# Patient Record
Sex: Male | Born: 1980 | Race: Black or African American | Hispanic: No | Marital: Married
Health system: Southern US, Community
[De-identification: ages and names within clinical notes are randomized; demographics above are authoritative.]

---

## 2021-04-10 ENCOUNTER — Other Ambulatory Visit: Payer: Self-pay

## 2021-04-10 ENCOUNTER — Emergency Department (HOSPITAL_COMMUNITY): Payer: BC Managed Care – PPO

## 2021-04-10 ENCOUNTER — Emergency Department (HOSPITAL_COMMUNITY)
Admission: EM | Admit: 2021-04-10 | Discharge: 2021-04-10 | Disposition: A | Discharge status: Home / Self Care | Payer: BC Managed Care – PPO | Attending: Emergency Medicine | Admitting: Emergency Medicine

## 2021-04-10 DIAGNOSIS — R030 Elevated blood-pressure reading, without diagnosis of hypertension: Secondary | ICD-10-CM | POA: Diagnosis present

## 2021-04-10 DIAGNOSIS — R519 Headache, unspecified: Secondary | ICD-10-CM | POA: Insufficient documentation

## 2021-04-10 DIAGNOSIS — I10 Essential (primary) hypertension: Secondary | ICD-10-CM | POA: Insufficient documentation

## 2021-04-10 MED ORDER — HYDROCODONE-ACETAMINOPHEN 5-325 MG PO TABS
1.0000 | ORAL_TABLET | Freq: Once | ORAL | Status: AC
  Administered 2021-04-10: 1 via ORAL
  Filled 2021-04-10: qty 1

## 2021-04-10 MED ORDER — KETOROLAC TROMETHAMINE 30 MG/ML IJ SOLN
30.0000 mg | Freq: Once | INTRAMUSCULAR | Status: AC
  Administered 2021-04-10: 30 mg via INTRAMUSCULAR
  Filled 2021-04-10: qty 1

## 2021-04-10 MED ORDER — METOCLOPRAMIDE HCL 5 MG/ML IJ SOLN
10.0000 mg | Freq: Once | INTRAMUSCULAR | Status: AC
  Administered 2021-04-10: 10 mg via INTRAMUSCULAR
  Filled 2021-04-10: qty 2

## 2021-04-10 NOTE — ED Provider Notes (Signed)
[facility]-EMERGENCY DEPT Provider Note   CSN: [id number] Arrival date & time: 04/10/21  1512     History Chief Complaint  Patient presents with   Hypertension    James Smith is a 40 y.o. male.  40 year old male presents with frontal headache times several days.  Denies any emesis.  No fever or chills.  No visual loss but slight blurred vision.  Does have a history of migraines in the past.  Has had decreased sleep recently.  No prior history of high blood pressure.  Denies any chest pain or chest pressure.  He has not been short of breath.  No treatment use prior to arrival nothing makes his symptoms better or worse      No past medical history on file.  There are no problems to display for this patient.        No family history on file.     Home Medications Prior to Admission medications   Not on File    Allergies    Patient has no known allergies.  Review of Systems   Review of Systems  All other systems reviewed and are negative.  Physical Exam Updated Vital Signs BP (!) 175/135 (BP Location: Left Arm)   Pulse 86   Temp 98.4 F (36.9 C) (Oral)   Resp 18   SpO2 100%   Physical Exam Vitals and nursing note reviewed.  Constitutional:      General: He is not in acute distress.    Appearance: Normal appearance. He is well-developed. He is not toxic-appearing.  HENT:     Head: Normocephalic and atraumatic.  Eyes:     General: Lids are normal.     Conjunctiva/sclera: Conjunctivae normal.     Pupils: Pupils are equal, round, and reactive to light.  Neck:     Thyroid: No thyroid mass.     Trachea: No tracheal deviation.  Cardiovascular:     Rate and Rhythm: Normal rate and regular rhythm.     Heart sounds: Normal heart sounds. No murmur heard.   No gallop.  Pulmonary:     Effort: Pulmonary effort is normal. No respiratory distress.     Breath sounds: Normal breath sounds. No stridor. No decreased breath sounds,  wheezing, rhonchi or rales.  Abdominal:     General: There is no distension.     Palpations: Abdomen is soft.     Tenderness: There is no abdominal tenderness. There is no rebound.  Musculoskeletal:        General: No tenderness. Normal range of motion.     Cervical back: Normal range of motion and neck supple.  Skin:    General: Skin is warm and dry.     Findings: No abrasion or rash.  Neurological:     Mental Status: He is alert and oriented to person, place, and time. Mental status is at baseline.     GCS: GCS eye subscore is 4. GCS verbal subscore is 5. GCS motor subscore is 6.     Cranial Nerves: Cranial nerves are intact. No cranial nerve deficit.     Sensory: No sensory deficit.     Motor: Motor function is intact.  Psychiatric:        Attention and Perception: Attention normal.        Speech: Speech normal.        Behavior: Behavior normal.    ED Results / Procedures / Treatments   Labs (all labs ordered are listed, but only  abnormal results are displayed) Labs Reviewed - No data to display  EKG None  Radiology CT Head Wo Contrast  Result Date: 04/10/2021 CLINICAL DATA:  Headache. Symptoms for 3 days. Dizziness and blurred vision. History of migraines. EXAM: CT HEAD WITHOUT CONTRAST TECHNIQUE: Contiguous axial images were obtained from the base of the skull through the vertex without intravenous contrast. COMPARISON:  None. FINDINGS: Brain: No mass lesion, hemorrhage, hydrocephalus, acute infarct, intra-axial, or extra-axial fluid collection. Vascular: No hyperdense vessel or unexpected calcification. Skull: Normal Sinuses/Orbits: Normal imaged portions of the orbits and globes. Clear paranasal sinuses and mastoid air cells. Other: None. IMPRESSION: Normal head CT. Electronically Signed   By: Jeronimo Greaves M.D.   On: 04/10/2021 15:51    Procedures Procedures   Medications Ordered in ED Medications  metoCLOPramide (REGLAN) injection 10 mg (has no administration in time  range)    ED Course  I have reviewed the triage vital signs and the nursing notes.  Pertinent labs & imaging results that were available during my care of the patient were reviewed by me and considered in my medical decision making (see chart for details).    MDM Rules/Calculators/A&P                           Patient medicated for headache here and feels better.  Head CT is negative.  Neurological exam is stable.  Will discharge home Final Clinical Impression(s) / ED Diagnoses Final diagnoses:  None    Rx / DC Orders ED Discharge Orders     None        Lorre Nick, MD 04/10/21 1836

## 2021-04-10 NOTE — ED Provider Notes (Signed)
Emergency Medicine Provider Triage Evaluation Note  James Smith , a 40 y.o. male  was evaluated in triage.  Pt complains of headache x3 days. He further c/o dizziness and blurred vision to the left eye. States he had migraines in his 25s but he has not had a migraine for many years   Review of Systems  Positive: Headache, dizziness, blurred vision Negative: Numbness, weakness  Physical Exam  BP (!) 175/135 (BP Location: Left Arm)   Pulse 86   Temp 98.4 F (36.9 C) (Oral)   Resp 18   SpO2 100%  Gen:   Awake, no distress   Resp:  Normal effort  MSK:   Moves extremities without difficulty  Other:  Clear speech, no facial droop, normal coordination  Medical Decision Making  Medically screening exam initiated at 3:35 PM.  Appropriate orders placed.  Taitum Alms was informed that the remainder of the evaluation will be completed by another provider, this initial triage assessment does not replace that evaluation, and the importance of remaining in the ED until their evaluation is complete.     Rayne Du 04/10/21 1537    Lorre Nick, MD 04/10/21 2258

## 2021-04-10 NOTE — Discharge Instructions (Addendum)
Have your blood pressure rechecked by your doctor

## 2021-04-10 NOTE — ED Triage Notes (Signed)
Pt states that for the past 3 days he has headache that is across his forehead.  Responds to OTC meds but then returns soon after. No hx of high BP.  Recently had physical for work with no concerns.

## 2022-10-23 ENCOUNTER — Emergency Department (HOSPITAL_COMMUNITY)
Admission: EM | Admit: 2022-10-23 | Discharge: 2022-10-24 | Discharge status: Against Medical Advice | Payer: BC Managed Care – PPO | Attending: Emergency Medicine | Admitting: Emergency Medicine

## 2022-10-23 ENCOUNTER — Encounter (HOSPITAL_COMMUNITY): Payer: Self-pay

## 2022-10-23 ENCOUNTER — Other Ambulatory Visit: Payer: Self-pay

## 2022-10-23 ENCOUNTER — Emergency Department (HOSPITAL_COMMUNITY): Payer: BC Managed Care – PPO

## 2022-10-23 DIAGNOSIS — R079 Chest pain, unspecified: Secondary | ICD-10-CM | POA: Insufficient documentation

## 2022-10-23 DIAGNOSIS — Z5321 Procedure and treatment not carried out due to patient leaving prior to being seen by health care provider: Secondary | ICD-10-CM | POA: Diagnosis not present

## 2022-10-23 LAB — CBC
HCT: 42.4 % (ref 39.0–52.0)
Hemoglobin: 14.3 g/dL (ref 13.0–17.0)
MCH: 28.5 pg (ref 26.0–34.0)
MCHC: 33.7 g/dL (ref 30.0–36.0)
MCV: 84.6 fL (ref 80.0–100.0)
Platelets: 234 10*3/uL (ref 150–400)
RBC: 5.01 MIL/uL (ref 4.22–5.81)
RDW: 12.1 % (ref 11.5–15.5)
WBC: 5.1 10*3/uL (ref 4.0–10.5)
nRBC: 0 % (ref 0.0–0.2)

## 2022-10-23 LAB — BASIC METABOLIC PANEL
Anion gap: 10 (ref 5–15)
BUN: 9 mg/dL (ref 6–20)
CO2: 27 mmol/L (ref 22–32)
Calcium: 9.4 mg/dL (ref 8.9–10.3)
Chloride: 102 mmol/L (ref 98–111)
Creatinine, Ser: 1.26 mg/dL — ABNORMAL HIGH (ref 0.61–1.24)
GFR, Estimated: >60 mL/min (ref >60)
Glucose, Bld: 104 mg/dL — ABNORMAL HIGH (ref 70–99)
Potassium: 3.2 mmol/L — ABNORMAL LOW (ref 3.5–5.1)
Sodium: 139 mmol/L (ref 135–145)

## 2022-10-23 LAB — TROPONIN I (HIGH SENSITIVITY): Troponin I (High Sensitivity): 5 ng/L (ref <18)

## 2022-10-23 NOTE — ED Triage Notes (Signed)
Pt arrived from home via POV c/o Chest pain 4/10 at present. Pt states that it just feels like a lot of pressure. Pt states that it started yesterday but he can no longer ignore it. Pt states that he has a lot of stress on him at present.

## 2022-10-24 IMAGING — CT CT HEAD W/O CM
3 series · 14 of 47 positions shown, 16 images · non-contrast
Comparison: None.

CLINICAL DATA: Headache. Symptoms for 3 days. Dizziness and blurred
vision. History of migraines.

EXAM:
CT HEAD WITHOUT CONTRAST
TECHNIQUE: Contiguous axial images were obtained from the base of the skull
through the vertex without intravenous contrast.

[Series 2: head wo · axial · 0.47mm/px · z∈[+1461,+1601]mm · 8 of 34 slices shown, 10 images]
[im 3/34  brain]
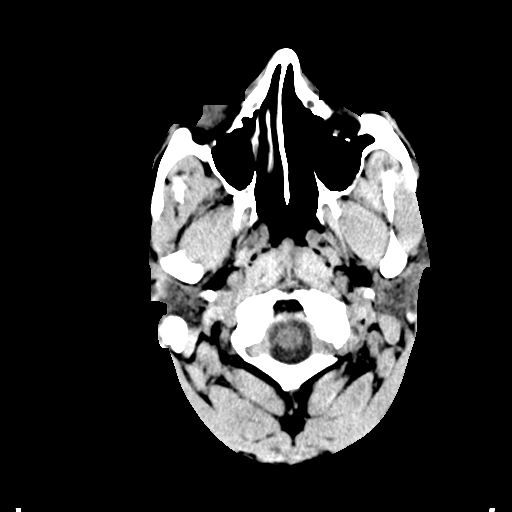
[im 3/34  bone]
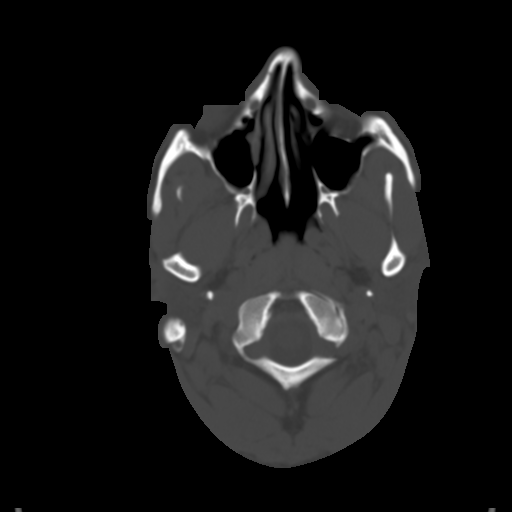
[im 7/34  brain]
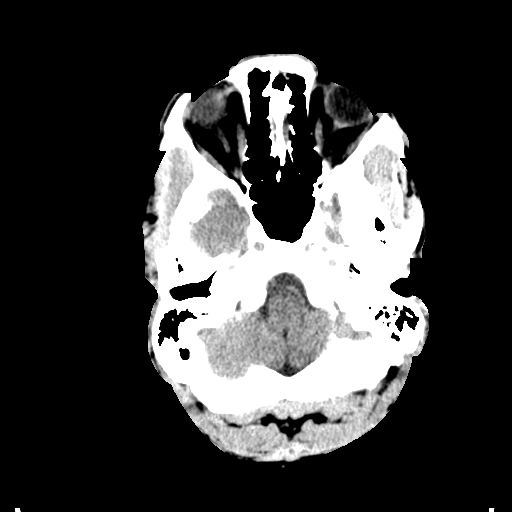
[im 11/34  brain]
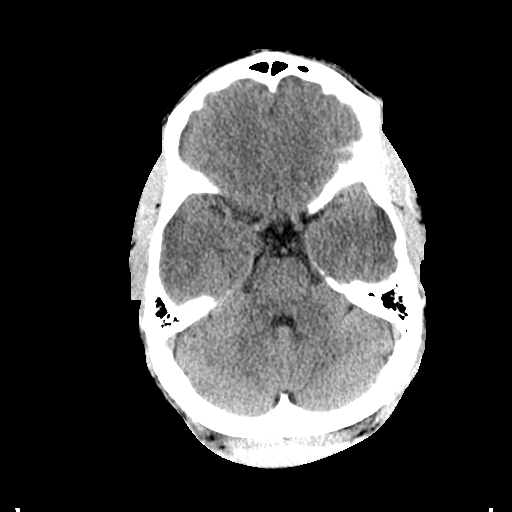
[im 15/34  brain]
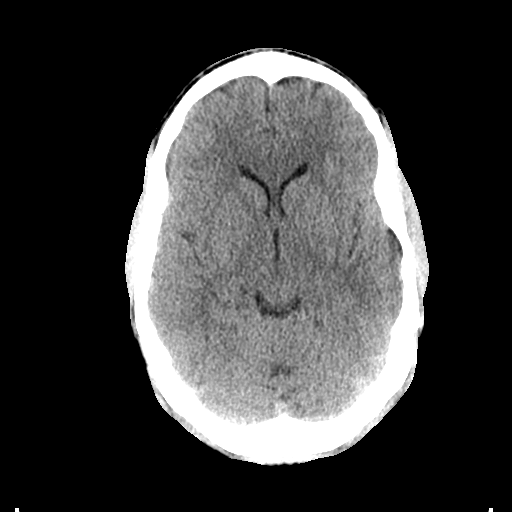
[im 19/34  brain]
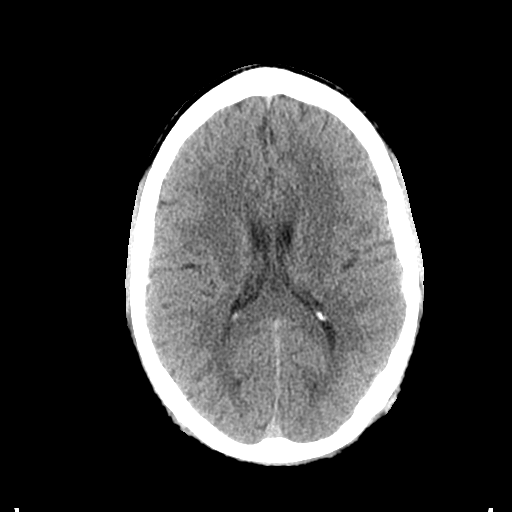
[im 19/34  bone]
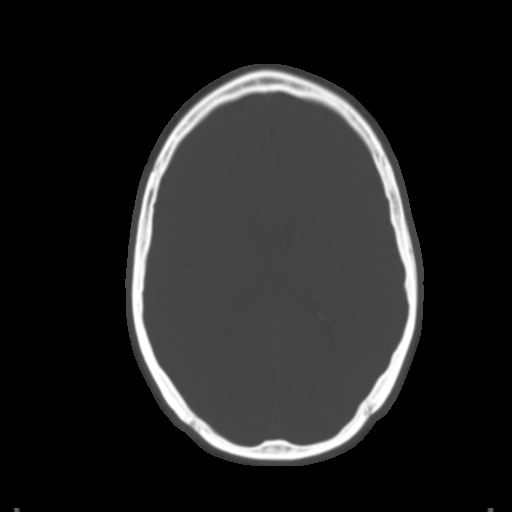
[im 23/34  brain]
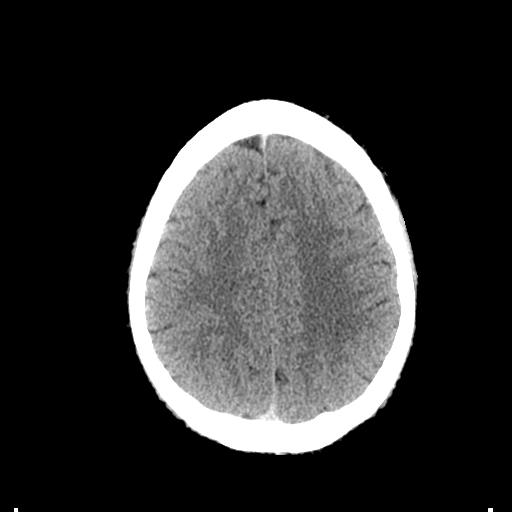
[im 27/34  brain]
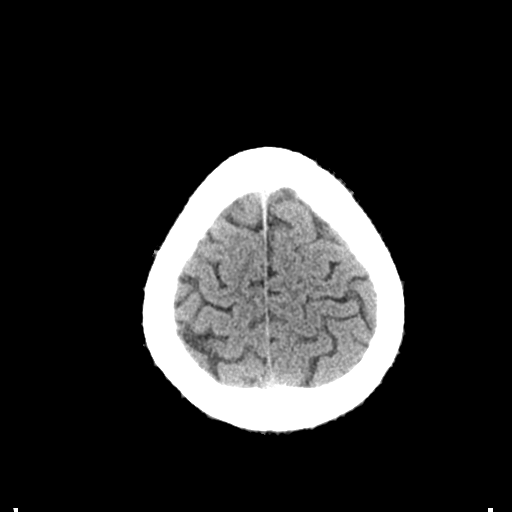
[im 31/34  brain]
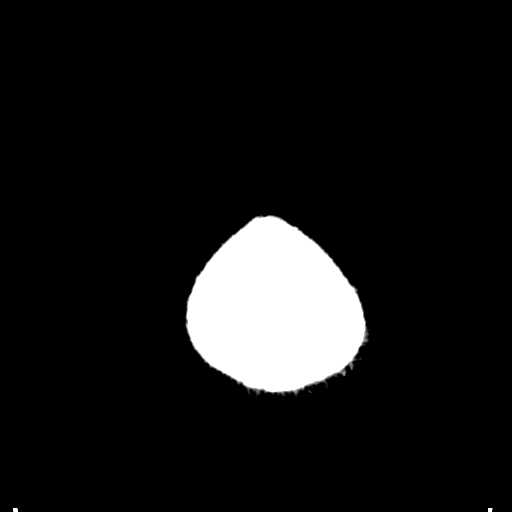

[Series 5: coronal soft tissue · coronal · 0.34mm/px · 3 of 71 slices shown]
[im 24/71  brain]
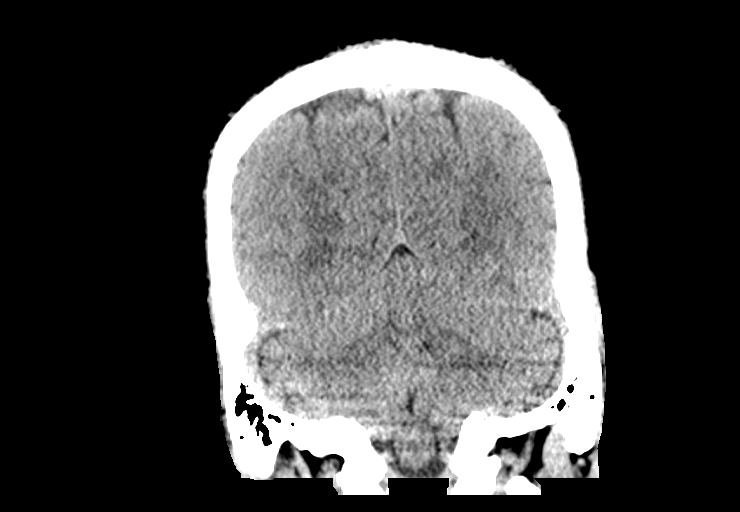
[im 32/71  brain]
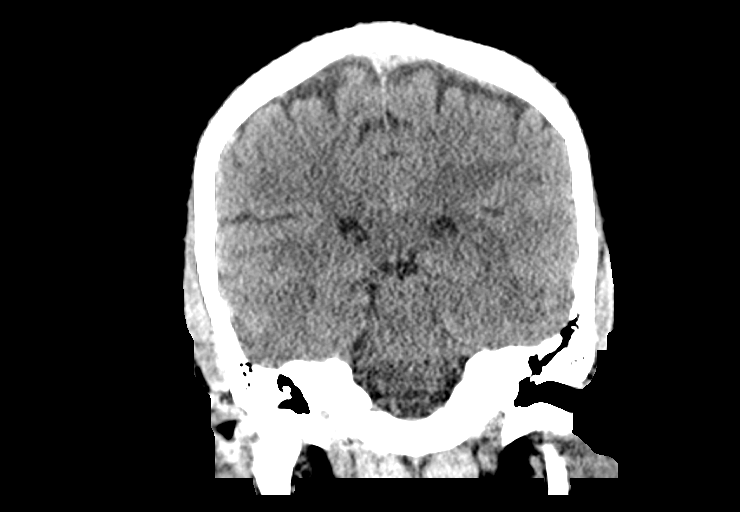
[im 39/71  brain]
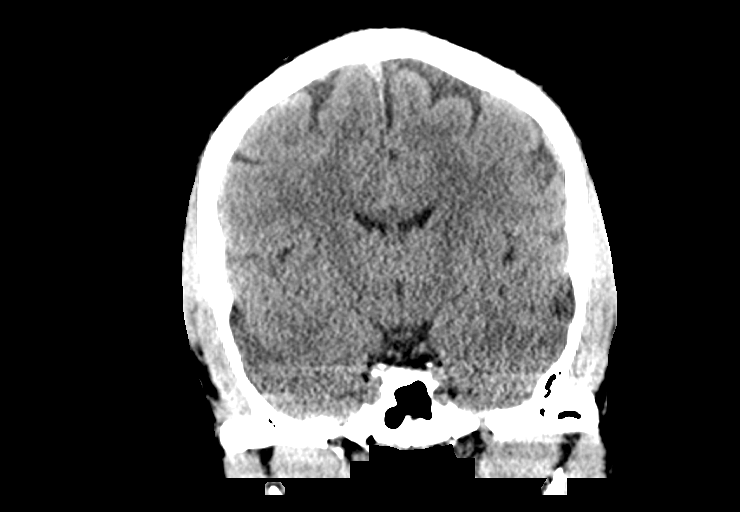

[Series 6: sagittal soft tissue · sagittal · 0.35mm/px · 3 of 54 slices shown]
[im 18/54  brain]
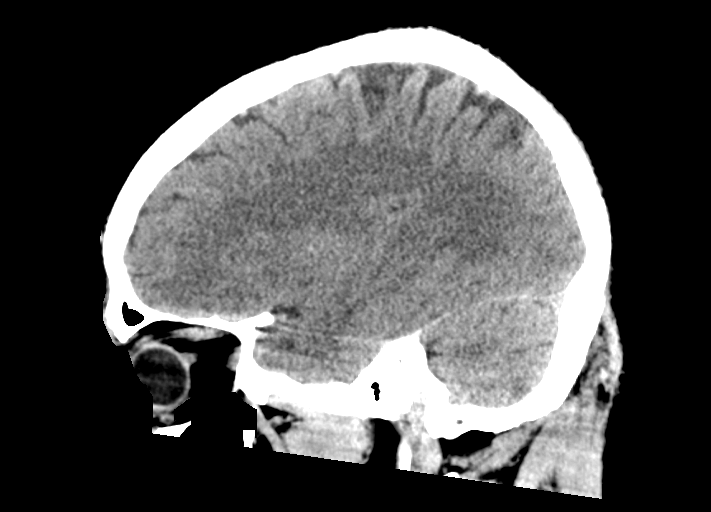
[im 27/54  brain]
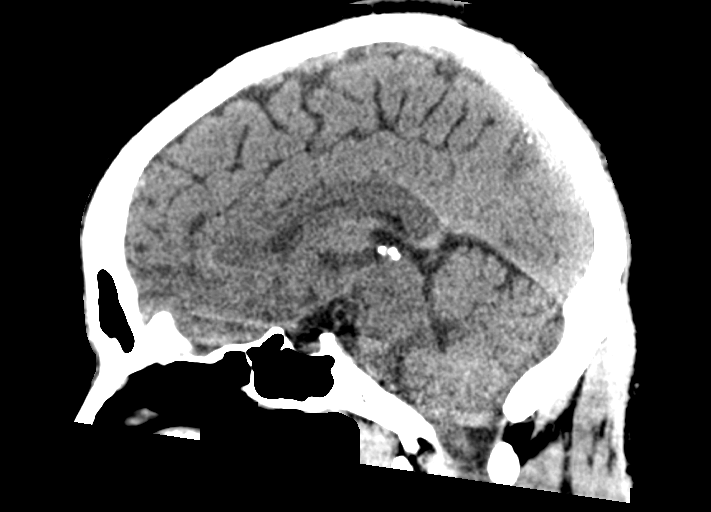
[im 36/54  brain]
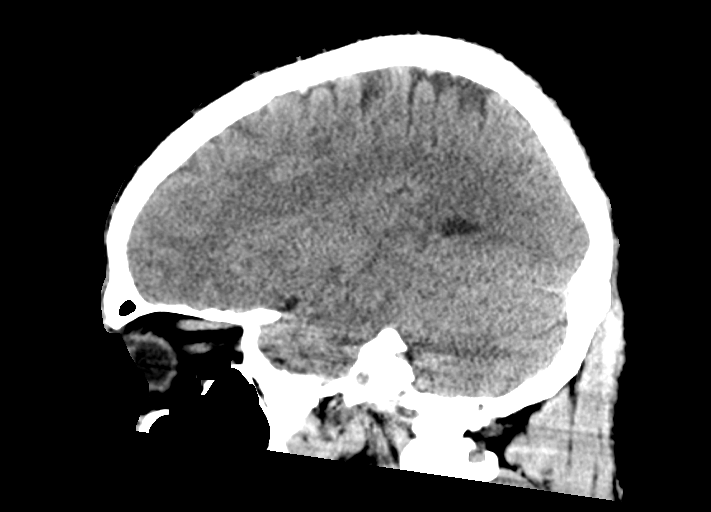

[14 of 47 positions shown; findings below may reference images not displayed]

FINDINGS: Brain: No mass lesion, hemorrhage, hydrocephalus, acute infarct,
intra-axial, or extra-axial fluid collection.

Vascular: No hyperdense vessel or unexpected calcification.

Skull: Normal

Sinuses/Orbits: Normal imaged portions of the orbits and globes.
Clear paranasal sinuses and mastoid air cells.

Other: None.
IMPRESSION: Normal head CT.

## 2022-10-24 NOTE — ED Notes (Signed)
Called pt x3 to take back to room 29, no answer.  Notified CN
